# Patient Record
Sex: Female | Born: 1990 | Race: Black or African American | Hispanic: No | Marital: Single | Smoking: Never smoker
Health system: Southern US, Community
[De-identification: ages and names within clinical notes are randomized; demographics above are authoritative.]

## PROBLEM LIST (undated history)

## (undated) HISTORY — PX: OOPHORECTOMY: SHX6387

---

## 2021-09-22 ENCOUNTER — Encounter (HOSPITAL_COMMUNITY): Payer: Self-pay

## 2021-09-22 ENCOUNTER — Telehealth: Payer: Self-pay

## 2021-09-22 ENCOUNTER — Emergency Department (HOSPITAL_COMMUNITY)
Admission: EM | Admit: 2021-09-22 | Discharge: 2021-09-22 | Disposition: A | Discharge status: Home / Self Care | Payer: Managed Care, Other (non HMO) | Attending: Emergency Medicine | Admitting: Emergency Medicine

## 2021-09-22 ENCOUNTER — Other Ambulatory Visit: Payer: Self-pay

## 2021-09-22 DIAGNOSIS — N939 Abnormal uterine and vaginal bleeding, unspecified: Secondary | ICD-10-CM | POA: Diagnosis present

## 2021-09-22 DIAGNOSIS — B9689 Other specified bacterial agents as the cause of diseases classified elsewhere: Secondary | ICD-10-CM | POA: Insufficient documentation

## 2021-09-22 DIAGNOSIS — N9489 Other specified conditions associated with female genital organs and menstrual cycle: Secondary | ICD-10-CM | POA: Diagnosis not present

## 2021-09-22 DIAGNOSIS — N760 Acute vaginitis: Secondary | ICD-10-CM | POA: Insufficient documentation

## 2021-09-22 DIAGNOSIS — N938 Other specified abnormal uterine and vaginal bleeding: Secondary | ICD-10-CM

## 2021-09-22 LAB — CBC WITH DIFFERENTIAL/PLATELET
Abs Immature Granulocytes: 0.02 10*3/uL (ref 0.00–0.07)
Basophils Absolute: 0 10*3/uL (ref 0.0–0.1)
Basophils Relative: 0 %
Eosinophils Absolute: 0 10*3/uL (ref 0.0–0.5)
Eosinophils Relative: 0 %
HCT: 31.8 % — ABNORMAL LOW (ref 36.0–46.0)
Hemoglobin: 10 g/dL — ABNORMAL LOW (ref 12.0–15.0)
Immature Granulocytes: 0 %
Lymphocytes Relative: 14 %
Lymphs Abs: 1.3 10*3/uL (ref 0.7–4.0)
MCH: 25.1 pg — ABNORMAL LOW (ref 26.0–34.0)
MCHC: 31.4 g/dL (ref 30.0–36.0)
MCV: 79.7 fL — ABNORMAL LOW (ref 80.0–100.0)
Monocytes Absolute: 0.5 10*3/uL (ref 0.1–1.0)
Monocytes Relative: 5 %
Neutro Abs: 7.6 10*3/uL (ref 1.7–7.7)
Neutrophils Relative %: 81 %
Platelets: 395 10*3/uL (ref 150–400)
RBC: 3.99 MIL/uL (ref 3.87–5.11)
RDW: 16.2 % — ABNORMAL HIGH (ref 11.5–15.5)
WBC: 9.6 10*3/uL (ref 4.0–10.5)
nRBC: 0 % (ref 0.0–0.2)

## 2021-09-22 LAB — URINALYSIS, ROUTINE W REFLEX MICROSCOPIC
Bilirubin Urine: NEGATIVE
Glucose, UA: NEGATIVE mg/dL
Ketones, ur: NEGATIVE mg/dL
Nitrite: NEGATIVE
Protein, ur: 30 mg/dL — AB
RBC / HPF: >50 RBC/hpf — ABNORMAL HIGH (ref 0–5)
Specific Gravity, Urine: 1.03 (ref 1.005–1.030)
pH: 5 (ref 5.0–8.0)

## 2021-09-22 LAB — COMPREHENSIVE METABOLIC PANEL
ALT: 11 U/L (ref 0–44)
AST: 15 U/L (ref 15–41)
Albumin: 3.4 g/dL — ABNORMAL LOW (ref 3.5–5.0)
Alkaline Phosphatase: 55 U/L (ref 38–126)
Anion gap: 7 (ref 5–15)
BUN: 8 mg/dL (ref 6–20)
CO2: 23 mmol/L (ref 22–32)
Calcium: 9 mg/dL (ref 8.9–10.3)
Chloride: 110 mmol/L (ref 98–111)
Creatinine, Ser: 0.82 mg/dL (ref 0.44–1.00)
GFR, Estimated: >60 mL/min (ref >60)
Glucose, Bld: 107 mg/dL — ABNORMAL HIGH (ref 70–99)
Potassium: 3.5 mmol/L (ref 3.5–5.1)
Sodium: 140 mmol/L (ref 135–145)
Total Bilirubin: 0.5 mg/dL (ref 0.3–1.2)
Total Protein: 7.2 g/dL (ref 6.5–8.1)

## 2021-09-22 LAB — WET PREP, GENITAL
Sperm: NONE SEEN
Trich, Wet Prep: NONE SEEN
WBC, Wet Prep HPF POC: <10 (ref <10)
Yeast Wet Prep HPF POC: NONE SEEN

## 2021-09-22 LAB — LIPASE, BLOOD: Lipase: 20 U/L (ref 11–51)

## 2021-09-22 LAB — I-STAT BETA HCG BLOOD, ED (MC, WL, AP ONLY): I-stat hCG, quantitative: <5 m[IU]/mL (ref <5)

## 2021-09-22 MED ORDER — METRONIDAZOLE 500 MG PO TABS: 500.0000 mg | ORAL_TABLET | Freq: Two times a day (BID) | ORAL | 0 refills | Status: DC

## 2021-09-22 MED ORDER — FERROUS SULFATE 325 (65 FE) MG PO TABS
325.0000 mg | ORAL_TABLET | Freq: Every day | ORAL | 0 refills | Status: DC
Start: 2021-09-22 — End: 2022-02-14

## 2021-09-22 NOTE — ED Provider Notes (Signed)
[facility] EMERGENCY DEPARTMENT Provider Note   CSN: [id number] Arrival date & time: 09/22/21  1520     History  Chief Complaint  Patient presents with   Vaginal Bleeding    Carrie Holloway is a 31 y.o. female with some past medical history here for evaluation of vaginal bleeding.  Her cycles have been lasting longer than normal and she is passing clots with her menstrual cycles.  Not on any blood thinners.  No pain, recent surgeries.  Denies chance of pregnancy.  No vaginal discharge. No syncope. Using pad/ tampons changing every few hrs. Prior oophorectomy due to large cyst. She has no pelvic or abdominal pain.  States she stood up today after sitting for long period of time and felt a gush of blood which scared her bring her here to the emergency department today.  HPI     Home Medications Prior to Admission medications   Medication Sig Start Date End Date Taking? Authorizing Provider  ferrous sulfate 325 (65 FE) MG tablet Take 1 tablet (325 mg total) by mouth daily. 09/22/21  Yes Sherrin Stahle A, PA-C  metroNIDAZOLE (FLAGYL) 500 MG tablet Take 1 tablet (500 mg total) by mouth 2 (two) times daily. 09/22/21  Yes Aliyha Fornes A, PA-C      Allergies    Patient has no known allergies.    Review of Systems   Review of Systems  Constitutional: Negative.   HENT: Negative.    Respiratory: Negative.    Cardiovascular: Negative.   Gastrointestinal: Negative.   Genitourinary:  Positive for vaginal bleeding. Negative for decreased urine volume, difficulty urinating, dyspareunia, dysuria, flank pain, frequency, hematuria, menstrual problem, pelvic pain, urgency, vaginal discharge and vaginal pain.  Musculoskeletal: Negative.   Skin: Negative.   Neurological: Negative.   All other systems reviewed and are negative.   Physical Exam Updated Vital Signs BP 140/72   Pulse 71   Temp 98.4 F (36.9 C) (Oral)   Resp 18   Ht 5\' 9"  (1.753 m)   Wt (!) 145.6 kg    SpO2 100%   BMI 47.40 kg/m  Physical Exam Vitals and nursing note reviewed. Exam conducted with a chaperone present.  Constitutional:      General: She is not in acute distress.    Appearance: She is well-developed. She is not ill-appearing, toxic-appearing or diaphoretic.  HENT:     Head: Atraumatic.  Eyes:     Pupils: Pupils are equal, round, and reactive to light.  Cardiovascular:     Rate and Rhythm: Normal rate.  Pulmonary:     Effort: No respiratory distress.  Abdominal:     General: There is no distension.     Palpations: Abdomen is soft.     Tenderness: There is no abdominal tenderness. There is no right CVA tenderness, left CVA tenderness, guarding or rebound.     Comments: Soft, nontender, no rebound or guarding  Genitourinary:    Comments: Normal appearing external female genitalia without rashes or lesions, normal vaginal epithelium. Normal appearing cervix without discharge or petechiae. Cervical os is closed. There is scant bleeding noted at the os.No Odor. Bimanual: No CMT,  nontender.  No palpable adnexal masses or tenderness. Uterus midline and not fixed. Rectovaginal exam was deferred.  No cystocele or rectocele noted. No pelvic lymphadenopathy noted. Wet prep was obtained.  Cultures for gonorrhea and chlamydia collected. Exam performed with chaperone in room.   Musculoskeletal:        General: Normal range  of motion.     Cervical back: Normal range of motion.  Skin:    General: Skin is warm and dry.  Neurological:     General: No focal deficit present.     Mental Status: She is alert.  Psychiatric:        Mood and Affect: Mood normal.     ED Results / Procedures / Treatments   Labs (all labs ordered are listed, but only abnormal results are displayed) Labs Reviewed  WET PREP, GENITAL - Abnormal; Notable for the following components:      Result Value   Clue Cells Wet Prep HPF POC PRESENT (*)    All other components within normal limits  CBC WITH  DIFFERENTIAL/PLATELET - Abnormal; Notable for the following components:   Hemoglobin 10.0 (*)    HCT 31.8 (*)    MCV 79.7 (*)    MCH 25.1 (*)    RDW 16.2 (*)    All other components within normal limits  COMPREHENSIVE METABOLIC PANEL - Abnormal; Notable for the following components:   Glucose, Bld 107 (*)    Albumin 3.4 (*)    All other components within normal limits  URINALYSIS, ROUTINE W REFLEX MICROSCOPIC - Abnormal; Notable for the following components:   Color, Urine AMBER (*)    APPearance CLOUDY (*)    Hgb urine dipstick LARGE (*)    Protein, ur 30 (*)    Leukocytes,Ua TRACE (*)    RBC / HPF >50 (*)    Bacteria, UA FEW (*)    All other components within normal limits  LIPASE, BLOOD  I-STAT BETA HCG BLOOD, ED ([facility], [facility], [facility] ONLY)  GC/CHLAMYDIA PROBE AMP ([place]) NOT AT [facility]    EKG None  Radiology No results found.  Procedures Procedures    Medications Ordered in ED Medications - No data to display  ED Course/ Medical Decision Making/ A&P    31 year old female otherwise well with no chronic medical problems here for evaluation of vaginal bleeding.  Menstrual cycles have been getting longer and she is passing clots.  No syncope, dizziness.  No pain.  Abdomen soft, nontender.  She is not anticoagulated.  No recent surgeries, denies chance of pregnancy.  Labs personally viewed and interpreted:  CBC no leukocytosis, hemoglobin 10.0, no prior to compare Metabolic panel no significant abnormality Lipase 20 Pregnancy test negative UA negative for infection, does show blood which I suspect is likely due to vaginal bleeding Wet prep with BV GC pending at dc  GU exam with mild blood at cervical os.  No brisk bleeding.  No clots  I discussed suspected dysfunctional uterine bleeding.  She has no pain I do not feel she needs emergent imaging at this time.  She is hemodynamically stable.  She is not currently followed with OB/GYN.  I discussed watchful waiting versus  starting her on hormonal birth control to help with her irregular and heavy menstrual cycles.  Patient does not want to start on medication at this time.  I have placed referral for her to get established with OB/GYN.  I encouraged return for new or worsening symptoms.  Patient agreeable.  On repeat exam patient does not have a surgical abdomin and there are no peritoneal signs.  No indication of appendicitis, bowel obstruction, bowel perforation, cholecystitis, diverticulitis, PID, TOA, torsion or ectopic pregnancy.    The patient has been appropriately medically screened and/or stabilized in the ED. I have low suspicion for any other emergent medical condition  which would require further screening, evaluation or treatment in the ED or require inpatient management.  Patient is hemodynamically stable and in no acute distress.  Patient able to ambulate in department prior to ED.  Evaluation does not show acute pathology that would require ongoing or additional emergent interventions while in the emergency department or further inpatient treatment.  I have discussed the diagnosis with the patient and answered all questions.  Pain is been managed while in the emergency department and patient has no further complaints prior to discharge.  Patient is comfortable with plan discussed in room and is stable for discharge at this time.  I have discussed strict return precautions for returning to the emergency department.  Patient was encouraged to follow-up with PCP/specialist refer to at discharge.                             Medical Decision Making Amount and/or Complexity of Data Reviewed External Data Reviewed: labs and notes. Labs: ordered. Decision-making details documented in ED Course.  Risk OTC drugs. Prescription drug management. Parenteral controlled substances. Diagnosis or treatment significantly limited by social determinants of health.          Final Clinical Impression(s) / ED  Diagnoses Final diagnoses:  Dysfunctional uterine bleeding  Bacterial vaginosis    Rx / DC Orders ED Discharge Orders          Ordered    Ambulatory referral to Obstetrics / Gynecology       Comments: Heavy menstrual cycles, establish care with Gyn   09/22/21 2253    metroNIDAZOLE (FLAGYL) 500 MG tablet  2 times daily        09/22/21 2255    ferrous sulfate 325 (65 FE) MG tablet  Daily        09/22/21 2255              Jerita Wimbush A, PA-C 09/22/21 2257    Curatolo, Adam, DO 09/23/21 0038

## 2021-09-22 NOTE — ED Triage Notes (Signed)
Reports irregular periods.  Started on 24th and then restarted a week later and having heavy bleeding.

## 2021-09-22 NOTE — Discharge Instructions (Addendum)
Pleasure taking care of you in the emergency department today  Your labs showed a slightly low hemoglobin however we did not have it prior to compare.  I discussed taking iron supplements.  Please use caution as this medication can cause constipation as it may cause black stools which is normal.  Keep a close eye on your menstrual cycle.  If your symptoms worsen please seek reevaluation.  You do have bacterial vaginosis on your swab today.  This is not an STD.  This is an overgrowth of the normal bacteria that lives in the vagina.  We have prescribed antibiotics for this called Flagyl.  Is important you do not drink alcohol while taking this medication or for 48 hours afterwards.  I have placed a referral to the OB/GYN for you to follow-up with.  They should be calling to schedule an appointment.  Return for new or worsening symptoms

## 2021-09-22 NOTE — ED Provider Triage Note (Signed)
Emergency Medicine Provider Triage Evaluation Note  Marykathryn Yellin , a 31 y.o. female  was evaluated in triage.  Pt complains of lower abdominal cramping and vaginal bleeding.  Cycles been lasting longer than normal, larger clots passing concerning.  Patient is not on any blood thinners, no recent surgeries.  Does not think she is pregnant..  Review of Systems  Per HPI  Physical Exam  BP (!) 162/84 (BP Location: Right Arm)   Pulse 84   Temp 99.5 F (37.5 C) (Oral)   Resp 16   Ht 5\' 9"  (1.753 m)   Wt (!) 145.6 kg   SpO2 99%   BMI 47.40 kg/m  Gen:   Awake, no distress   Resp:  Normal effort  MSK:   Moves extremities without difficulty  Other:  Abdomen is soft, nonspecifically tender.  Medical Decision Making  Medically screening exam initiated at 3:59 PM.  Appropriate orders placed.  Raychel Yon was informed that the remainder of the evaluation will be completed by another provider, this initial triage assessment does not replace that evaluation, and the importance of remaining in the ED until their evaluation is complete.     Sherrill Raring, PA-C 09/22/21 1600

## 2021-09-28 LAB — GC/CHLAMYDIA PROBE AMP (~~LOC~~) NOT AT ARMC
Chlamydia: POSITIVE — AB
Comment: NEGATIVE
Comment: NORMAL
Neisseria Gonorrhea: NEGATIVE

## 2021-10-03 ENCOUNTER — Emergency Department (HOSPITAL_COMMUNITY)
Admission: EM | Admit: 2021-10-03 | Discharge: 2021-10-03 | Disposition: A | Discharge status: Home / Self Care | Payer: Managed Care, Other (non HMO) | Attending: Emergency Medicine | Admitting: Emergency Medicine

## 2021-10-03 ENCOUNTER — Emergency Department (HOSPITAL_COMMUNITY): Payer: Managed Care, Other (non HMO)

## 2021-10-03 ENCOUNTER — Other Ambulatory Visit: Payer: Self-pay

## 2021-10-03 ENCOUNTER — Encounter (HOSPITAL_COMMUNITY): Payer: Self-pay

## 2021-10-03 DIAGNOSIS — S61452A Open bite of left hand, initial encounter: Secondary | ICD-10-CM | POA: Insufficient documentation

## 2021-10-03 DIAGNOSIS — W540XXA Bitten by dog, initial encounter: Secondary | ICD-10-CM | POA: Insufficient documentation

## 2021-10-03 DIAGNOSIS — Z23 Encounter for immunization: Secondary | ICD-10-CM | POA: Insufficient documentation

## 2021-10-03 MED ORDER — AMOXICILLIN-POT CLAVULANATE 875-125 MG PO TABS: 1.0000 | ORAL_TABLET | Freq: Two times a day (BID) | ORAL | 0 refills | Status: DC

## 2021-10-03 MED ORDER — TETANUS-DIPHTH-ACELL PERTUSSIS 5-2.5-18.5 LF-MCG/0.5 IM SUSY
0.5000 mL | PREFILLED_SYRINGE | Freq: Once | INTRAMUSCULAR | Status: AC
Start: 2021-10-03 — End: 2021-10-03
  Administered 2021-10-03: 0.5 mL via INTRAMUSCULAR
  Filled 2021-10-03: qty 0.5

## 2021-10-03 MED ORDER — AMOXICILLIN-POT CLAVULANATE 875-125 MG PO TABS
1.0000 | ORAL_TABLET | Freq: Once | ORAL | Status: AC
  Administered 2021-10-03: 1 via ORAL
  Filled 2021-10-03: qty 1

## 2021-10-03 NOTE — ED Provider Triage Note (Signed)
Emergency Medicine Provider Triage Evaluation Note  Carrie Holloway , a 31 y.o. female  was evaluated in triage.  Pt complains of dog bite.  States that she was at home when she tried to break up a fight between both of her dogs and was bit on her left palm.  States that one of the dogs does not have any shots, the other dog has needed updated shots. She keeps these dogs at home. Denies any numbness.  Review of Systems  Positive: Laceration Negative: Numbness  Physical Exam  BP (!) 150/97 (BP Location: Right Arm)   Pulse 76   Temp 98.7 F (37.1 C) (Oral)   Resp 17   Ht 5\' 9"  (1.753 m)   Wt (!) 145.6 kg   SpO2 100%   BMI 47.40 kg/m  Gen:   Awake, no distress   Resp:  Normal effort  MSK:   Moves extremities without difficulty  Other:  3 cm linear laceration noted on the left palm.  She is able to perform range of motion of the left thumb.  No active bleeding.  Other superficial abrasions noted to right arm  Medical Decision Making  Medically screening exam initiated at 12:53 PM.  Appropriate orders placed.  Caden Zorn was informed that the remainder of the evaluation will be completed by another provider, this initial triage assessment does not replace that evaluation, and the importance of remaining in the ED until their evaluation is complete.  Will need x-rays, wound closure and updated tetanus   Ave Filter, PA-C 10/03/21 1255

## 2021-10-03 NOTE — ED Triage Notes (Signed)
Was trying to break up her dogs that were fighting and one dog got left hand with medium lac to thenar of left hand.  Patient has full mobility to thumb.  Unknown tetanus.  Dogs need shots but dogs are inside dogs and only goes out on the leash.

## 2021-10-03 NOTE — Discharge Instructions (Addendum)
Suture removal in 8 days.  Watch carefully for any sign of infection °

## 2021-10-03 NOTE — ED Provider Notes (Signed)
[facility] EMERGENCY DEPARTMENT Provider Note   CSN: [id number] Arrival date & time: 10/03/21  1200     History  Chief Complaint  Patient presents with   Animal Bite    Carrie Holloway is a 31 y.o. female.  Patient complains of a dog bite to her left hand patient reports her dogs were playing and she reached between them.  Patient complains of a laceration to her left hand.  Patient reports her shots are up-to-date.  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 No language interpreter was used.  Animal Bite Contact animal:  Dog Pain details:    Quality:  Localized   Severity:  No pain   Progression:  Worsening Provoked: unprovoked   Animal's rabies vaccination status:  Unknown Tetanus status:  Unknown Relieved by:  Nothing Worsened by:  Nothing      Home Medications Prior to Admission medications   Medication Sig Start Date End Date Taking? Authorizing Provider  ferrous sulfate 325 (65 FE) MG tablet Take 1 tablet (325 mg total) by mouth daily. 09/22/21   Henderly, Britni A, PA-C  metroNIDAZOLE (FLAGYL) 500 MG tablet Take 1 tablet (500 mg total) by mouth 2 (two) times daily. 09/22/21   Henderly, Britni A, PA-C      Allergies    Patient has no known allergies.    Review of Systems   Review of Systems  All other systems reviewed and are negative.   Physical Exam Updated Vital Signs BP (!) 150/97 (BP Location: Right Arm)   Pulse 76   Temp 98.7 F (37.1 C) (Oral)   Resp 17   Ht 5\' 9"  (1.753 m)   Wt (!) 145.6 kg   SpO2 100%   BMI 47.40 kg/m  Physical Exam Vitals and nursing note reviewed.  Constitutional:      Appearance: She is well-developed.  HENT:     Head: Normocephalic.  Pulmonary:     Effort: Pulmonary effort is normal.  Abdominal:     General: There is no distension.  Musculoskeletal:        General: Normal range of motion.     Cervical back: Normal range of  motion.     Comments: 2.6 cm laceration left hand thenar area, from  nv and ns intact    Skin:    General: Skin is warm.  Neurological:     General: No focal deficit present.     Mental Status: She is alert and oriented to person, place, and time.  Psychiatric:        Mood and Affect: Mood normal.     ED Results / Procedures / Treatments   Labs (all labs ordered are listed, but only abnormal results are displayed) Labs Reviewed - No data to display  EKG None  Radiology DG Hand Complete Left  Result Date: 10/03/2021 CLINICAL DATA:  Dog bite laceration to the left hand. EXAM: LEFT HAND - COMPLETE 3+ VIEW COMPARISON:  None Available. FINDINGS: No fracture.  No bone lesion. Joints are normally spaced and aligned. Soft tissue swelling with a small amount of soft tissue air over the dorsal ulnar aspect of the hand. No radiopaque foreign body. IMPRESSION: 1. No fracture, dislocation or radiopaque foreign body. Electronically Signed   By: 10/05/2021 M.D.   On: 10/03/2021 13:19    Procedures .06/21/2023Laceration Repair  Date/Time: 10/03/2021 7:47 PM  Performed by:  Elson Areas, PA-C Authorized by: Elson Areas, PA-C   Consent:    Consent obtained:  Verbal   Consent given by:  Patient   Risks, benefits, and alternatives were discussed: yes     Risks discussed:  Infection and pain   Alternatives discussed:  No treatment Universal protocol:    Immediately prior to procedure, a time out was called: yes     Patient identity confirmed:  Verbally with patient Anesthesia:    Anesthesia method:  Local infiltration   Local anesthetic:  Lidocaine 1% w/o epi Laceration details:    Location:  Hand   Hand location:  L palm   Length (cm):  2.6 Pre-procedure details:    Preparation:  Patient was prepped and draped in usual sterile fashion and imaging obtained to evaluate for foreign bodies Exploration:    Limited defect created (wound extended): no     Imaging obtained: x-ray     Wound  exploration: wound explored through full range of motion   Treatment:    Area cleansed with:  Povidone-iodine   Amount of cleaning:  Extensive   Irrigation solution:  Sterile saline   Debridement:  None   Undermining:  None Skin repair:    Repair method:  Sutures   Suture size:  5-0   Suture material:  Prolene   Suture technique:  Simple interrupted   Number of sutures:  2 Approximation:    Approximation:  Loose Repair type:    Repair type:  Simple Post-procedure details:    Procedure completion:  Tolerated Comments:     No evidence of tendon, vascular or nerve damage.  Pt counseled on risk of infection      Medications Ordered in ED Medications  Tdap (BOOSTRIX) injection 0.5 mL (has no administration in time range)  amoxicillin-clavulanate (AUGMENTIN) 875-125 MG per tablet 1 tablet (has no administration in time range)    ED Course/ Medical Decision Making/ A&P                           Medical Decision Making Risk Prescription drug management.           Final Clinical Impression(s) / ED Diagnoses Final diagnoses:  Dog bite of left hand, initial encounter    Rx / DC Orders ED Discharge Orders          Ordered    amoxicillin-clavulanate (AUGMENTIN) 875-125 MG tablet  2 times daily        10/03/21 1950          An After Visit Summary was printed and given to the patient.     Elson Areas, Cordelia Poche 10/03/21 1951    Glynn Octave, MD 10/04/21 [phone]

## 2021-10-15 ENCOUNTER — Ambulatory Visit (HOSPITAL_COMMUNITY)
Admission: EM | Admit: 2021-10-15 | Discharge: 2021-10-15 | Disposition: A | Discharge status: Home / Self Care | Payer: Managed Care, Other (non HMO)

## 2021-10-15 ENCOUNTER — Other Ambulatory Visit: Payer: Self-pay

## 2021-10-15 ENCOUNTER — Encounter (HOSPITAL_COMMUNITY): Payer: Self-pay | Admitting: *Deleted

## 2021-10-15 NOTE — ED Triage Notes (Signed)
2 sutures removed

## 2021-10-15 NOTE — ED Triage Notes (Signed)
Pt here today for suture removal from Lt hand.

## 2022-02-14 ENCOUNTER — Encounter: Payer: Self-pay | Admitting: Nurse Practitioner

## 2022-02-14 ENCOUNTER — Ambulatory Visit (INDEPENDENT_AMBULATORY_CARE_PROVIDER_SITE_OTHER): Payer: Managed Care, Other (non HMO) | Admitting: Nurse Practitioner

## 2022-02-14 VITALS — BP 121/78 | HR 88 | Temp 98.7°F | Ht 69.0 in | Wt 322.0 lb

## 2022-02-14 DIAGNOSIS — Z0000 Encounter for general adult medical examination without abnormal findings: Secondary | ICD-10-CM | POA: Diagnosis not present

## 2022-02-14 DIAGNOSIS — Z6841 Body Mass Index (BMI) 40.0 and over, adult: Secondary | ICD-10-CM | POA: Diagnosis not present

## 2022-02-14 NOTE — Progress Notes (Addendum)
New Patient Note  RE: Carrie Holloway MRN: [id number] DOB: [birth year] Date of Office Visit: 02/14/2022  Chief Complaint: Establish Care and Annual Exam  History of Present Illness: .   Encounter for general adult medical examination Physical: Patient's last physical exam was few year ago .  Weight: Is not appropriate for height (BMI greater then 27%) ;  Blood Pressure: Normal (BP less than 120/80) ;  Medical History: Patient history reviewed ; Family history reviewed ;  Allergies Reviewed: No change in current allergies ;  Medications Reviewed: Medications reviewed - no changes ;  Lipids: Normal lipid levels ; blood completed results pending Smoking: Life-long non-smoker ;  Physical Activity: Does exercises at least 3 times per week ;  Alcohol/Drug Use: Is a non-drinker ; illicit drug use ; marijuana, Patient is not afflicted from Stress Incontinence and Urge Incontinence  Safety: reviewed ; Patient wears a seat belt, has smoke detectors, has carbon monoxide detector and wears sunscreen with extended sun exposure. Dental Care: No annual cleanings, brushes and flosses daily. Ophthalmology/Optometry: Not applicable Hearing loss: none Vision impairments: none     Assessment and Plan: Carrie Holloway is a 31 y.o. female with: Annual physical exam Patient is a 31 year old female who presents to clinic to establish care and complete a physical exam. Head to toe assessment completed.  Patient has no new concerns.  Education provided to patient on health maintenance and preventative care.  Patient will return to complete a Pap. Labs completed today-CBC, CMP, lipid panel, HIV, TSH.  Vitamin D.   BMI 45.0-49.9, adult [facility]) Patient also presents with a BMI of 47.55 and weighs 322 pounds.  Printed handouts given on healthy eating and lifestyle.  I will work on patient's diet and exercise with weight loss in the next few months.  Return in about 1 year (around 02/15/2023) for Annual physical  Exam.   Diagnostics:   Past Medical History: Patient Active Problem List   Diagnosis Date Noted   Annual physical exam 02/14/2022   BMI 45.0-49.9, adult ([place]) 02/14/2022   History reviewed. No pertinent past medical history. Past Surgical History: Past Surgical History:  Procedure Laterality Date   OOPHORECTOMY N/A    Medication List:  Current Outpatient Medications  Medication Sig Dispense Refill   Cholecalciferol (VITAMIN D3) 50 MCG (2000 UT) capsule Take 1 capsule (2,000 Units total) by mouth daily. 90 capsule 1   ferrous sulfate (FE TABS) 325 (65 FE) MG EC tablet Take 1 tablet (325 mg total) by mouth daily with breakfast. 90 tablet 1   No current facility-administered medications for this visit.   Allergies: No Known Allergies Social History: Social History   Socioeconomic History   Marital status: Single    Spouse name: Not on file   Number of children: Not on file   Years of education: Not on file   Highest education level: Not on file  Occupational History   Not on file  Tobacco Use   Smoking status: Never   Smokeless tobacco: Never  Vaping Use   Vaping Use: Never used  Substance and Sexual Activity   Alcohol use: Yes    Comment: socical   Drug use: Yes    Types: Marijuana   Sexual activity: Yes    Birth control/protection: None  Other Topics Concern   Not on file  Social History Narrative   Not on file   Social Determinants of Health   Financial Resource Strain: Not on file  Food Insecurity: Not on file  Transportation Needs: Not on file  Physical Activity: Not on file  Stress: Not on file  Social Connections: Not on file       Family History: Family History  Problem Relation Age of Onset   Diabetes Mother    Hypertension Mother    Diabetes Father    Hypertension Father    Diabetes Brother    Diabetes Paternal Grandfather          Review of Systems  Constitutional: Negative.   HENT: Negative.    Eyes: Negative.    Respiratory: Negative.    Cardiovascular: Negative.   Gastrointestinal: Negative.   Genitourinary: Negative.   Musculoskeletal: Negative.   Hematological: Negative.   All other systems reviewed and are negative.  Objective: BP 121/78   Pulse 88   Temp 98.7 F (37.1 C)   Ht 5\' 9"  (1.753 m)   Wt (!) 322 lb (146.1 kg)   LMP 02/02/2022 (Exact Date) Comment: end date  SpO2 96%   BMI 47.55 kg/m  Body mass index is 47.55 kg/m.   Physical Exam Vitals and nursing note reviewed.  Constitutional:      Appearance: Normal appearance.  HENT:     Head: Normocephalic.     Right Ear: External ear normal.     Left Ear: External ear normal.     Nose: Nose normal.     Mouth/Throat:     Mouth: Mucous membranes are moist.     Pharynx: Oropharynx is clear.  Eyes:     Conjunctiva/sclera: Conjunctivae normal.  Cardiovascular:     Rate and Rhythm: Normal rate and regular rhythm.     Pulses: Normal pulses.  Pulmonary:     Effort: Pulmonary effort is normal.     Breath sounds: Normal breath sounds.  Abdominal:     General: Bowel sounds are normal.  Skin:    General: Skin is warm.  Neurological:     General: No focal deficit present.     Mental Status: She is alert and oriented to person, place, and time.  Psychiatric:        Mood and Affect: Mood normal.        Behavior: Behavior normal.    The plan was reviewed with the patient/family, and all questions/concerned were addressed.  It was my pleasure to see Carrie Holloway today and participate in her care. Please feel free to contact me with any questions or concerns.  Sincerely,  May NP [facility]

## 2022-02-14 NOTE — Assessment & Plan Note (Signed)
Patient is a 31 year old female who presents to clinic to establish care and complete a physical exam. Head to toe assessment completed.  Patient has no new concerns.  Education provided to patient on health maintenance and preventative care.  Patient will return to complete a Pap. Labs completed today-CBC, CMP, lipid panel, HIV, TSH.  Vitamin D.

## 2022-02-14 NOTE — Assessment & Plan Note (Signed)
Patient also presents with a BMI of 47.55 and weighs 322 pounds.  Printed handouts given on healthy eating and lifestyle.  I will work on patient's diet and exercise with weight loss in the next few months.

## 2022-02-14 NOTE — Patient Instructions (Signed)

## 2022-02-15 LAB — CBC WITH DIFFERENTIAL/PLATELET
Basophils Absolute: 0 10*3/uL (ref 0.0–0.2)
Basos: 0 %
EOS (ABSOLUTE): 0.1 10*3/uL (ref 0.0–0.4)
Eos: 1 %
Hematocrit: 32.2 % — ABNORMAL LOW (ref 34.0–46.6)
Hemoglobin: 10.1 g/dL — ABNORMAL LOW (ref 11.1–15.9)
Immature Grans (Abs): 0 10*3/uL (ref 0.0–0.1)
Immature Granulocytes: 0 %
Lymphocytes Absolute: 1.5 10*3/uL (ref 0.7–3.1)
Lymphs: 21 %
MCH: 23.3 pg — ABNORMAL LOW (ref 26.6–33.0)
MCHC: 31.4 g/dL — ABNORMAL LOW (ref 31.5–35.7)
MCV: 74 fL — ABNORMAL LOW (ref 79–97)
Monocytes Absolute: 0.4 10*3/uL (ref 0.1–0.9)
Monocytes: 6 %
Neutrophils Absolute: 5 10*3/uL (ref 1.4–7.0)
Neutrophils: 72 %
Platelets: 400 10*3/uL (ref 150–450)
RBC: 4.34 x10E6/uL (ref 3.77–5.28)
RDW: 18.1 % — ABNORMAL HIGH (ref 11.7–15.4)
WBC: 7 10*3/uL (ref 3.4–10.8)

## 2022-02-15 LAB — THYROID PANEL WITH TSH
Free Thyroxine Index: 1.5 (ref 1.2–4.9)
T3 Uptake Ratio: 29 % (ref 24–39)
T4, Total: 5.3 ug/dL (ref 4.5–12.0)
TSH: 2.04 u[IU]/mL (ref 0.450–4.500)

## 2022-02-15 LAB — LIPID PANEL
Chol/HDL Ratio: 3.1 ratio (ref 0.0–4.4)
Cholesterol, Total: 168 mg/dL (ref 100–199)
HDL: 55 mg/dL (ref >39)
LDL Chol Calc (NIH): 98 mg/dL (ref 0–99)
Triglycerides: 82 mg/dL (ref 0–149)
VLDL Cholesterol Cal: 15 mg/dL (ref 5–40)

## 2022-02-15 LAB — CMP14+EGFR
ALT: <5 IU/L (ref 0–32)
AST: 14 IU/L (ref 0–40)
Albumin/Globulin Ratio: 1.3 (ref 1.2–2.2)
Albumin: 3.8 g/dL — ABNORMAL LOW (ref 4.0–5.0)
Alkaline Phosphatase: 64 IU/L (ref 44–121)
BUN/Creatinine Ratio: 11 (ref 9–23)
BUN: 9 mg/dL (ref 6–20)
Bilirubin Total: 0.3 mg/dL (ref 0.0–1.2)
CO2: 17 mmol/L — ABNORMAL LOW (ref 20–29)
Calcium: 9.1 mg/dL (ref 8.7–10.2)
Chloride: 108 mmol/L — ABNORMAL HIGH (ref 96–106)
Creatinine, Ser: 0.85 mg/dL (ref 0.57–1.00)
Globulin, Total: 3 g/dL (ref 1.5–4.5)
Glucose: 88 mg/dL (ref 70–99)
Potassium: 4.4 mmol/L (ref 3.5–5.2)
Sodium: 139 mmol/L (ref 134–144)
Total Protein: 6.8 g/dL (ref 6.0–8.5)
eGFR: 95 mL/min/{1.73_m2} (ref >59)

## 2022-02-15 LAB — HIV ANTIBODY (ROUTINE TESTING W REFLEX): HIV Screen 4th Generation wRfx: NONREACTIVE

## 2022-02-15 LAB — HEPATITIS C ANTIBODY: Hep C Virus Ab: NONREACTIVE

## 2022-02-15 LAB — VITAMIN D 25 HYDROXY (VIT D DEFICIENCY, FRACTURES): Vit D, 25-Hydroxy: 24.4 ng/mL — ABNORMAL LOW (ref 30.0–100.0)

## 2022-02-19 ENCOUNTER — Other Ambulatory Visit: Payer: Self-pay | Admitting: Nurse Practitioner

## 2022-02-19 DIAGNOSIS — E611 Iron deficiency: Secondary | ICD-10-CM

## 2022-02-19 DIAGNOSIS — E559 Vitamin D deficiency, unspecified: Secondary | ICD-10-CM

## 2022-02-19 MED ORDER — FERROUS SULFATE 325 (65 FE) MG PO TBEC: 325.0000 mg | DELAYED_RELEASE_TABLET | Freq: Every day | ORAL | 1 refills | Status: AC

## 2022-02-19 MED ORDER — VITAMIN D3 50 MCG (2000 UT) PO CAPS: 2000.0000 [IU] | ORAL_CAPSULE | Freq: Every day | ORAL | 1 refills | Status: AC

## 2023-06-23 IMAGING — DX DG HAND COMPLETE 3+V*L*
3 series · 3 of 3 positions shown · non-contrast
Comparison: None Available.

CLINICAL DATA: Dog bite laceration to the left hand.

EXAM:
LEFT HAND - COMPLETE 3+ VIEW

[hand pa]
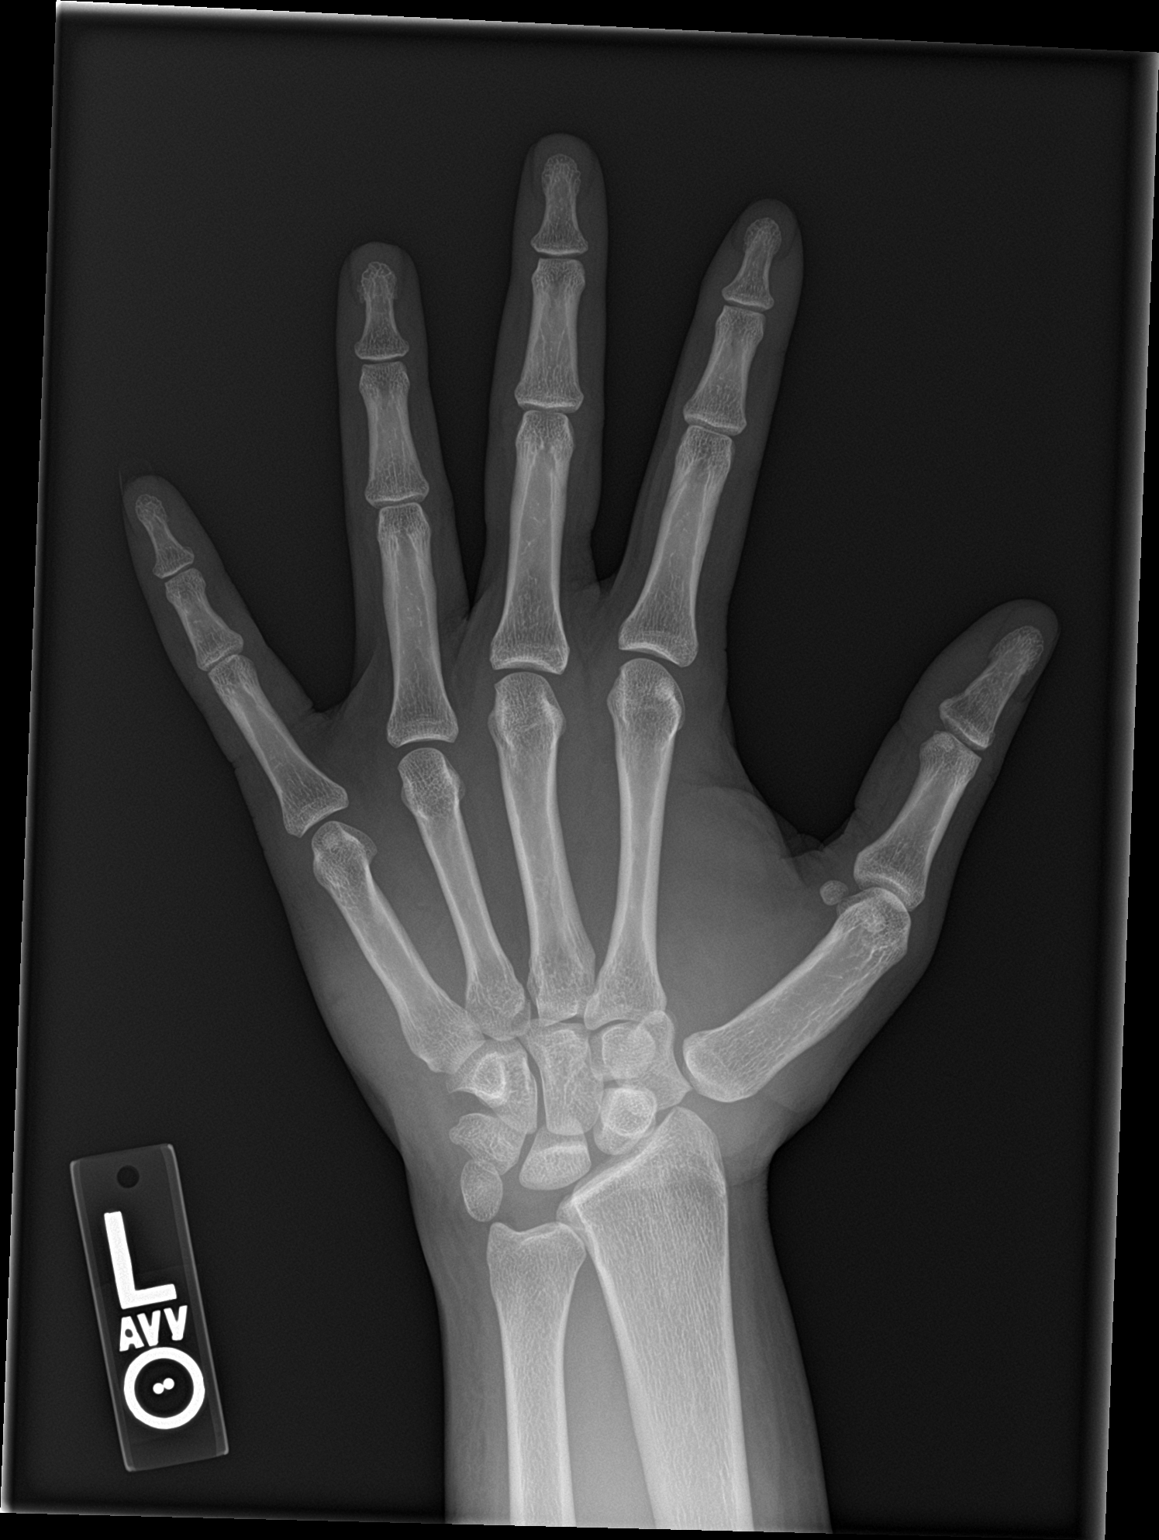

[hand obl]
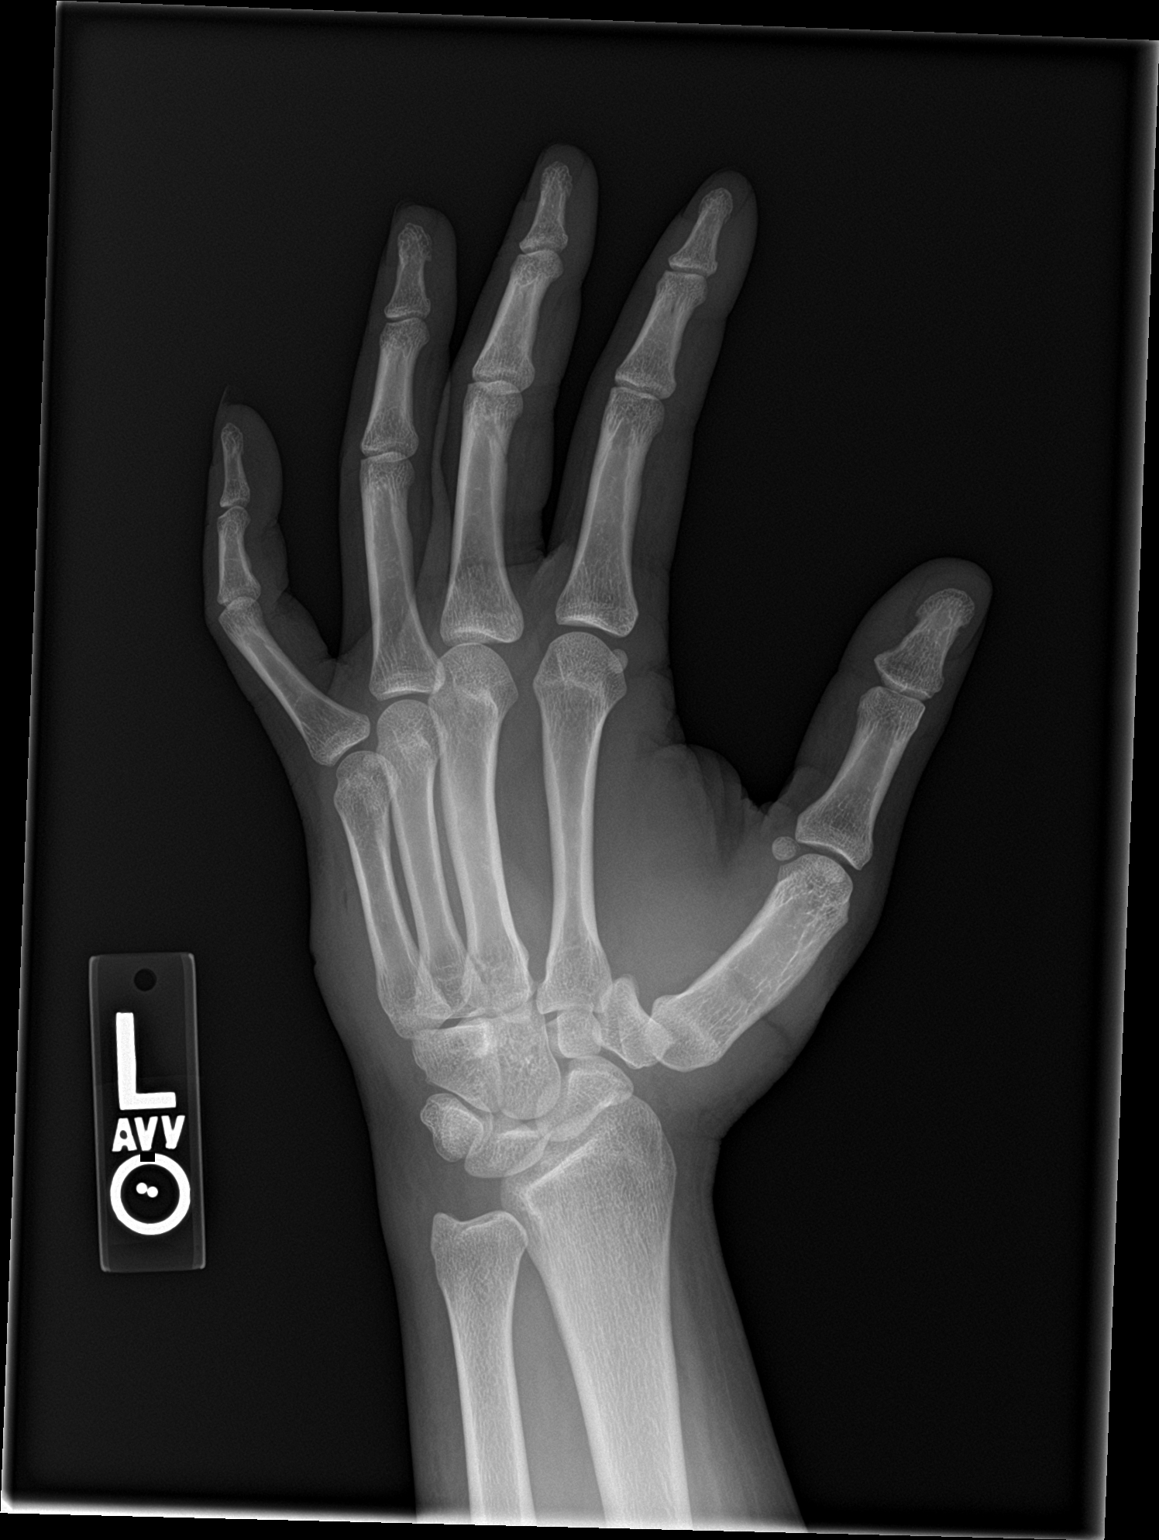

[hand lat]
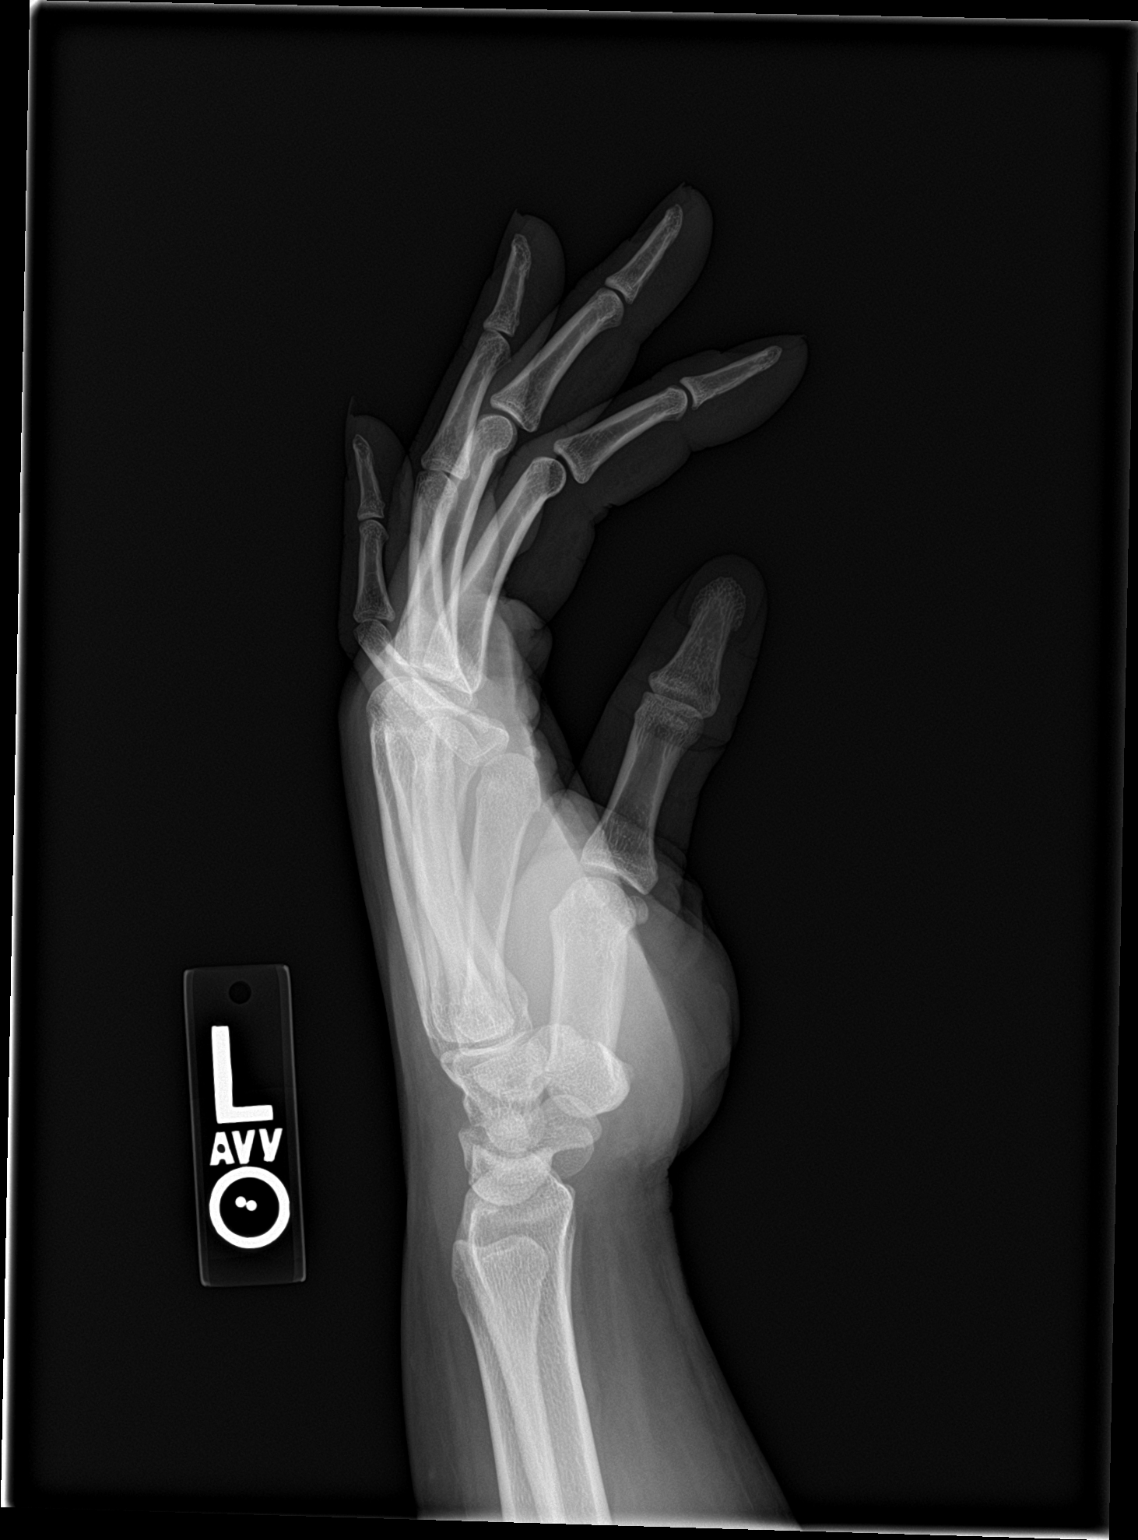

[3 of 3 positions shown; findings below may reference images not displayed]

FINDINGS: No fracture.  No bone lesion.

Joints are normally spaced and aligned.

Soft tissue swelling with a small amount of soft tissue air over the
dorsal ulnar aspect of the hand. No radiopaque foreign body.
IMPRESSION: 1. No fracture, dislocation or radiopaque foreign body.
